# Patient Record
Sex: Female | Born: 1995 | Race: White | Hispanic: No | Marital: Single | Smoking: Never smoker
Health system: Southern US, Community
[De-identification: ages and names within clinical notes are randomized; demographics above are authoritative.]

---
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
## 2018-07-23 ENCOUNTER — Other Ambulatory Visit: Payer: Self-pay

## 2018-07-23 ENCOUNTER — Encounter (HOSPITAL_COMMUNITY): Payer: Self-pay

## 2018-07-23 ENCOUNTER — Ambulatory Visit (HOSPITAL_COMMUNITY)
Admission: EM | Admit: 2018-07-23 | Discharge: 2018-07-23 | Disposition: A | Discharge status: Home / Self Care | Payer: PRIVATE HEALTH INSURANCE | Attending: Family Medicine | Admitting: Family Medicine

## 2018-07-23 ENCOUNTER — Ambulatory Visit (INDEPENDENT_AMBULATORY_CARE_PROVIDER_SITE_OTHER): Payer: PRIVATE HEALTH INSURANCE

## 2018-07-23 DIAGNOSIS — R7611 Nonspecific reaction to tuberculin skin test without active tuberculosis: Secondary | ICD-10-CM

## 2018-07-23 NOTE — ED Triage Notes (Signed)
Pt cc pt states she went to [facility] to have a PPD test done and yesterday she was told it was positive.

## 2018-07-23 NOTE — ED Provider Notes (Signed)
[facility]   [id number] 07/23/18 Arrival Time: [id number]  ASSESSMENT & PLAN:  1. Positive PPD    I have personally viewed the imaging studies ordered this visit. No signs of current or old TB.  Copy of radiologist report given to her along with discharge paperwork.  May f/u as needed.  Reviewed expectations re: course of current medical issues. Questions answered. Outlined signs and symptoms indicating need for more acute intervention. Patient verbalized understanding. After Visit Summary given.   SUBJECTIVE: History from: patient. Rebecca Luna is a 22 y.o. female who reports having a + PPD test that was read yesterday. Needs CXR for [employer]; [employer]. No persistent coughing or coughing up blood. Feels healthy. Weight stable. No TB exposure. No prolonged out of country travel.  ROS: As per HPI.   OBJECTIVE:  Vitals:   07/23/18 1026 07/23/18 1043  BP: 111/82   Pulse: 99   Resp: 16   Temp: 98.2 F (36.8 C)   TempSrc: Oral   SpO2: 100%   Weight:  72.6 kg    General appearance: alert; no distress; appears healthy Lungs: clear to auscultation bilaterally; unlabored Heart: regular rate and rhythm without murmer Extremities: no edema Skin: warm and dry Psychological: alert and cooperative; normal mood and affect  Imaging: Dg Chest 2 View  Result Date: 07/23/2018 CLINICAL DATA:  Positive TB skin test. EXAM: CHEST - 2 VIEW COMPARISON:  None. FINDINGS: Heart size is normal. Mediastinal shadows are normal. The lungs are clear. No bronchial thickening. No infiltrate, mass, effusion or collapse. Pulmonary vascularity is normal. No bony abnormality. No sign of old or active granulomatous infection. IMPRESSION: Normal chest. Electronically Signed   By: Paulina FusiMark  Shogry M.D.   On: 07/23/2018 11:06   NKDA  PMH: No TB exposure known.  Social History   Socioeconomic History  . Marital status: Single    Spouse name: Not on file  . Number of children: Not  on file  . Years of education: Not on file  . Highest education level: Not on file  Occupational History  . Not on file  Social Needs  . Financial resource strain: Not on file  . Food insecurity:    Worry: Not on file    Inability: Not on file  . Transportation needs:    Medical: Not on file    Non-medical: Not on file  Tobacco Use  . Smoking status: Never Smoker  . Smokeless tobacco: Never Used  Substance and Sexual Activity  . Alcohol use: Not on file  . Drug use: Not on file  . Sexual activity: Not on file  Lifestyle  . Physical activity:    Days per week: Not on file    Minutes per session: Not on file  . Stress: Not on file  Relationships  . Social connections:    Talks on phone: Not on file    Gets together: Not on file    Attends religious service: Not on file    Active member of club or organization: Not on file    Attends meetings of clubs or organizations: Not on file    Relationship status: Not on file  . Intimate partner violence:    Fear of current or ex partner: Not on file    Emotionally abused: Not on file    Physically abused: Not on file    Forced sexual activity: Not on file  Other Topics Concern  . Not on file  Social History Narrative  .  Not on file    History reviewed. No pertinent surgical history.   Mardella LaymanHagler, Demarious Kapur, MD 07/23/18 1336

## 2018-07-23 NOTE — Discharge Instructions (Addendum)
Your chest x-ray shows no signs of tuberculosis today.

## 2020-07-08 IMAGING — DX DG CHEST 2V
2 series · 2 of 2 positions shown · non-contrast
Comparison: None.

CLINICAL DATA: Positive TB skin test.

EXAM:
CHEST - 2 VIEW

[chest pa]
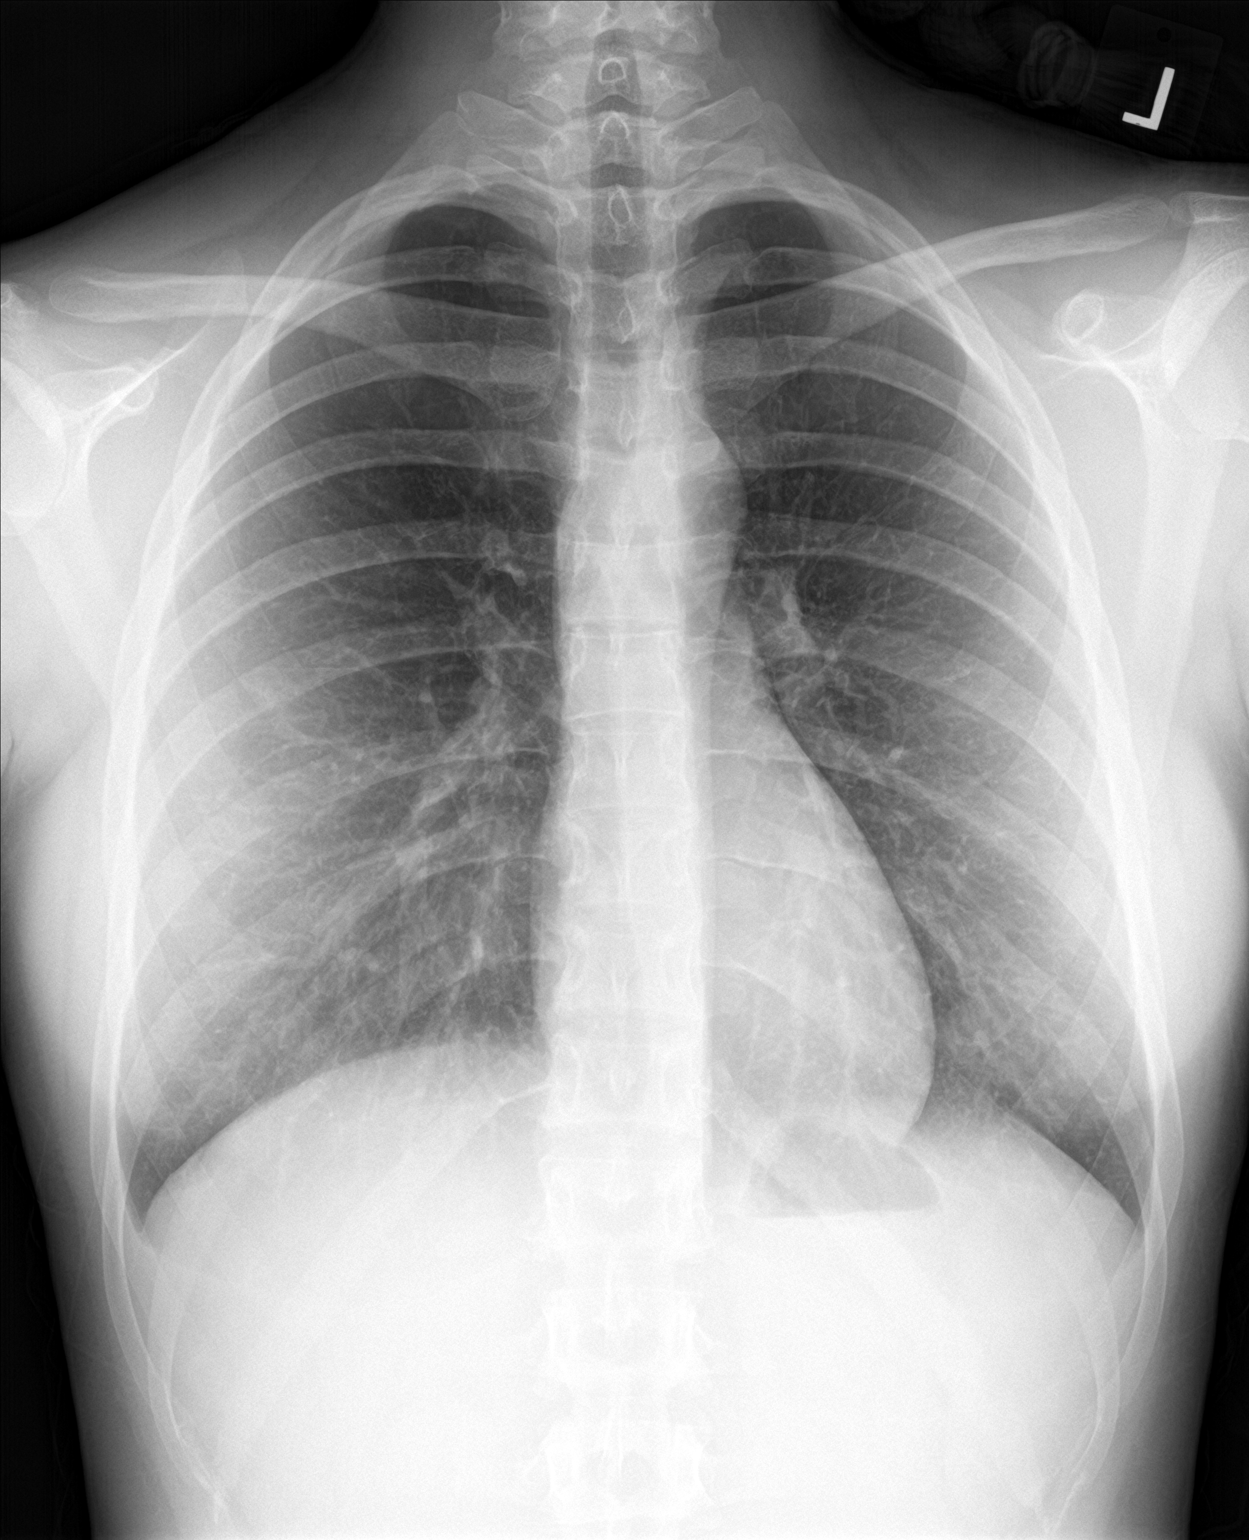

[chest lat]
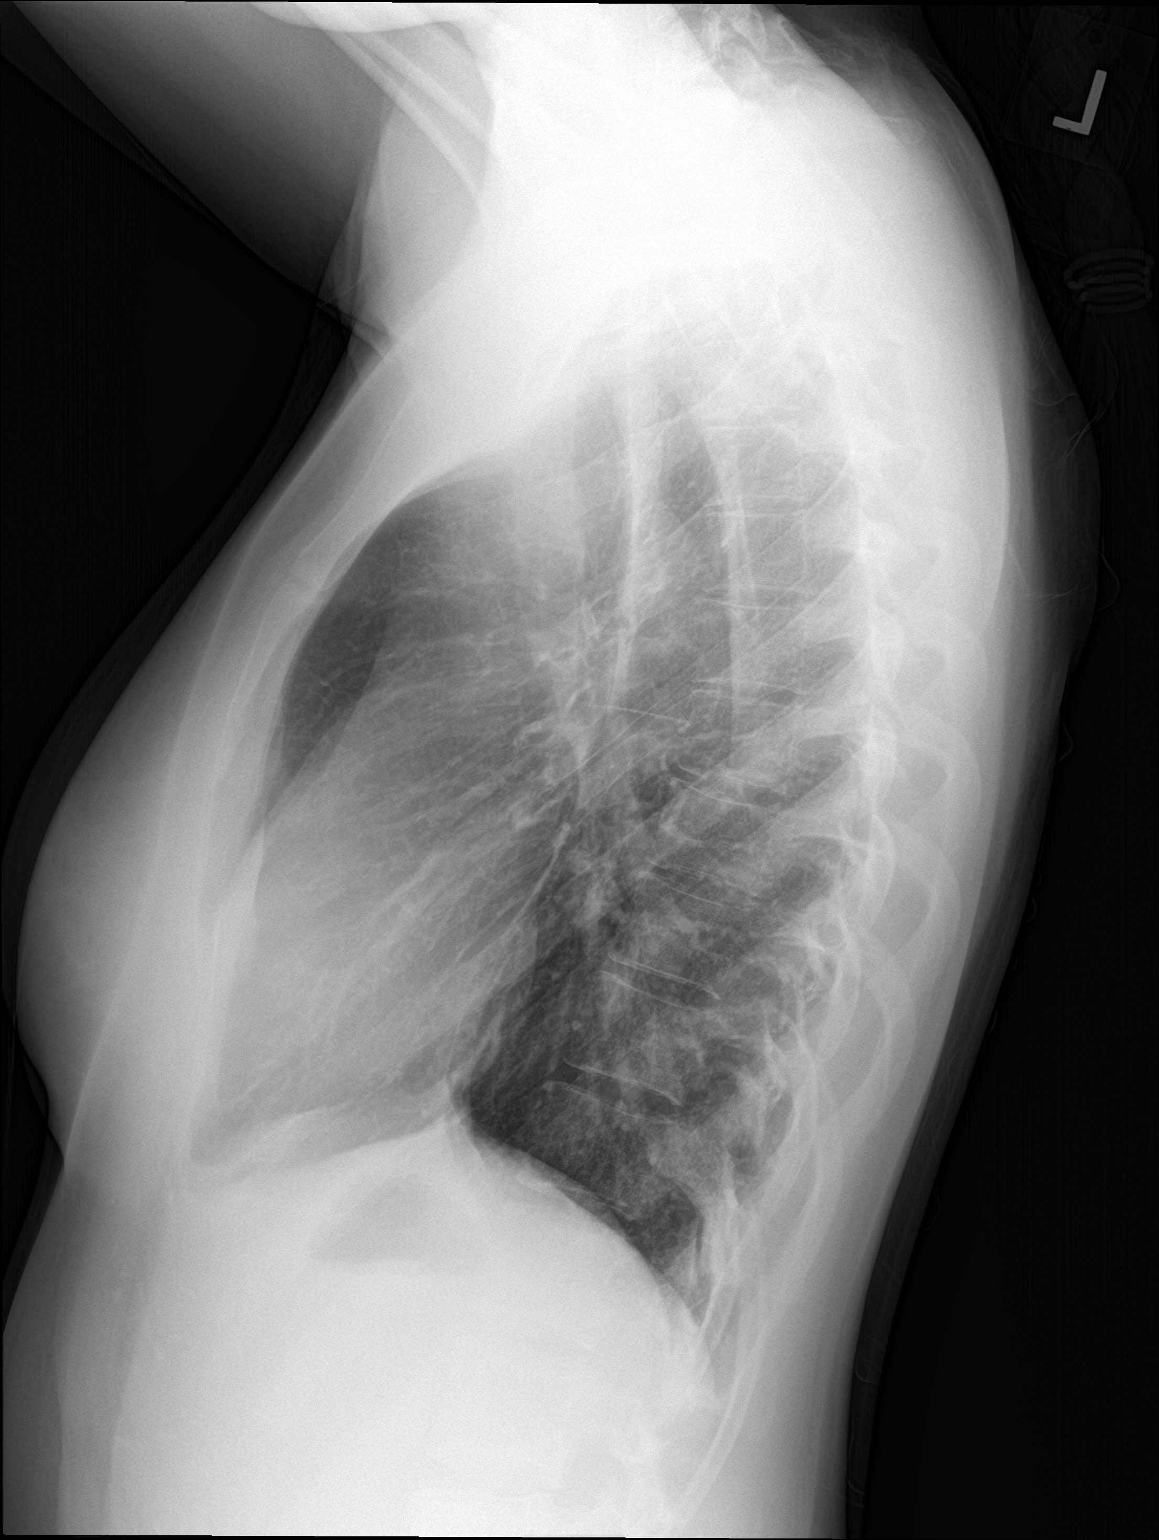

[2 of 2 positions shown; findings below may reference images not displayed]

FINDINGS: Heart size is normal. Mediastinal shadows are normal. The lungs are
clear. No bronchial thickening. No infiltrate, mass, effusion or
collapse. Pulmonary vascularity is normal. No bony abnormality. No
sign of old or active granulomatous infection.
IMPRESSION: Normal chest.
